# Patient Record
Sex: Male | Born: 1995 | Race: White | Hispanic: No | Marital: Single | Smoking: Current every day smoker
Health system: Southern US, Community
[De-identification: ages and names within clinical notes are randomized; demographics above are authoritative.]

---

## 2017-05-08 ENCOUNTER — Ambulatory Visit (INDEPENDENT_AMBULATORY_CARE_PROVIDER_SITE_OTHER): Payer: Self-pay

## 2017-05-08 ENCOUNTER — Other Ambulatory Visit: Payer: Self-pay

## 2017-05-08 ENCOUNTER — Ambulatory Visit (HOSPITAL_COMMUNITY)
Admission: EM | Admit: 2017-05-08 | Discharge: 2017-05-08 | Disposition: A | Discharge status: Home / Self Care | Payer: Self-pay | Attending: Urgent Care | Admitting: Urgent Care

## 2017-05-08 ENCOUNTER — Encounter (HOSPITAL_COMMUNITY): Payer: Self-pay | Admitting: Urgent Care

## 2017-05-08 DIAGNOSIS — Z720 Tobacco use: Secondary | ICD-10-CM

## 2017-05-08 DIAGNOSIS — R0981 Nasal congestion: Secondary | ICD-10-CM

## 2017-05-08 DIAGNOSIS — R0789 Other chest pain: Secondary | ICD-10-CM

## 2017-05-08 DIAGNOSIS — R059 Cough, unspecified: Secondary | ICD-10-CM

## 2017-05-08 DIAGNOSIS — R05 Cough: Secondary | ICD-10-CM

## 2017-05-08 DIAGNOSIS — R0602 Shortness of breath: Secondary | ICD-10-CM

## 2017-05-08 MED ORDER — PSEUDOEPHEDRINE HCL ER 120 MG PO TB12: 120.0000 mg | ORAL_TABLET | Freq: Two times a day (BID) | ORAL | 3 refills | Status: DC

## 2017-05-08 MED ORDER — BENZONATATE 100 MG PO CAPS: 100.0000 mg | ORAL_CAPSULE | Freq: Three times a day (TID) | ORAL | 0 refills | Status: DC | PRN

## 2017-05-08 MED ORDER — ACETAMINOPHEN 325 MG PO TABS
ORAL_TABLET | ORAL | Status: AC
Start: 2017-05-08 — End: 2017-05-08
  Filled 2017-05-08: qty 1

## 2017-05-08 MED ORDER — ACETAMINOPHEN 325 MG PO TABS
650.0000 mg | ORAL_TABLET | Freq: Once | ORAL | Status: AC
  Administered 2017-05-08: 650 mg via ORAL

## 2017-05-08 NOTE — ED Provider Notes (Signed)
  MRN: [id number] DOB: [birth year]  Subjective:   Joel Golden is a 22 y.o. male presenting for 2 week history of productive cough, body aches, nasal congestion. Cough elicits throat pain and mid-low sternal chest pain, has also had shob, started having vomiting (3 episodes) yesterday from his cough. Reports subjective fever today, some dizziness. Has tried ibuprofen, alka-seltzer, DayQuil, NyQuil with some relief. Smokes 1/2ppd. Does not hydrate well.   Review of Systems  HENT: Negative for ear discharge, ear pain and sinus pain.   Respiratory: Negative for hemoptysis.   Cardiovascular: Negative for palpitations.  Gastrointestinal: Negative for abdominal pain and nausea.  Skin: Negative for rash.    Joel Golden is not currently taking any medications and has No Known Allergies.  Joel Golden denies past medical and surgical history. Denies family history of lung disease.   Objective:   Vitals: BP (!) 112/56   Pulse (!) 120   Temp 100.1 F (37.8 C) (Oral)   Resp 18   SpO2 97%   Physical Exam  Constitutional: He is oriented to person, place, and time. He appears well-developed and well-nourished.  HENT:  Mouth/Throat: Oropharynx is clear and moist.  Eyes: No scleral icterus.  Neck: Normal range of motion. Neck supple.  Cardiovascular: Normal rate, regular rhythm and intact distal pulses. Exam reveals no gallop and no friction rub.  No murmur heard. Pulmonary/Chest: No respiratory distress. He has no wheezes. He has no rales.  Diminished lung sounds.  Lymphadenopathy:    He has no cervical adenopathy.  Neurological: He is alert and oriented to person, place, and time.  Skin: Skin is warm and dry.   Dg Chest 2 View  Result Date: 05/08/2017 CLINICAL DATA:  Cough and congestion. EXAM: CHEST  2 VIEW COMPARISON:  None. FINDINGS: The heart size and mediastinal contours are within normal limits. Both lungs are clear. The visualized skeletal structures are unremarkable. IMPRESSION: No active  cardiopulmonary disease. Electronically Signed   By: Gerome Samavid  Williams III M.D   On: 05/08/2017 19:29   Assessment and Plan :   Cough  Atypical chest pain  Nasal congestion  Shortness of breath  Tobacco use  Stop smoking, hydrate well. Use Tessalon, Sudafed. Return-to-clinic precautions discussed, patient verbalized understanding. Consider antibiotic course for sinusitis if symptoms persist. Patient did not have sinus pain and is actively smoking, not hydrating well so supportive measures appropriate to start with.   Wallis BambergMani, Brandilynn Taormina, PA-C 05/08/17 2002

## 2017-05-08 NOTE — ED Triage Notes (Signed)
Reports starting with cough, congestion, body aches approx 2 wks ago; states thought he was improving, but sxs worse again, and now started with vomiting yesterday.  States able to keep down some PO fluids inconsistently today.  Denies any known fevers.

## 2017-05-08 NOTE — Discharge Instructions (Signed)
For sore throat try using a honey-based tea. Use 3 teaspoons of honey with juice squeezed from half lemon. Place shaved pieces of ginger into 1/2-1 cup of water and warm over stove top. Then mix the ingredients and repeat every 4 hours as needed. Hydrate well with at least 2 liters of water.

## 2017-05-11 ENCOUNTER — Other Ambulatory Visit: Payer: Self-pay

## 2017-05-11 ENCOUNTER — Ambulatory Visit (HOSPITAL_COMMUNITY)
Admission: EM | Admit: 2017-05-11 | Discharge: 2017-05-11 | Disposition: A | Discharge status: Home / Self Care | Payer: Self-pay | Attending: Family Medicine | Admitting: Family Medicine

## 2017-05-11 ENCOUNTER — Encounter (HOSPITAL_COMMUNITY): Payer: Self-pay | Admitting: Emergency Medicine

## 2017-05-11 DIAGNOSIS — R6889 Other general symptoms and signs: Secondary | ICD-10-CM

## 2017-05-11 DIAGNOSIS — R05 Cough: Secondary | ICD-10-CM

## 2017-05-11 DIAGNOSIS — R059 Cough, unspecified: Secondary | ICD-10-CM

## 2017-05-11 MED ORDER — AZITHROMYCIN 250 MG PO TABS: 250.0000 mg | ORAL_TABLET | Freq: Every day | ORAL | 0 refills | Status: DC

## 2017-05-11 NOTE — ED Triage Notes (Signed)
Pt. Stated, I was here on Sunday and nobody gave me a diagnosis, the medicine I was given doesn't help

## 2017-05-12 NOTE — ED Provider Notes (Signed)
  [facility]   [id number] 05/11/17 Arrival Time: 1813  ASSESSMENT & PLAN:  1. Flu-like symptoms   2. Cough     Meds ordered this encounter  Medications  . azithromycin (ZITHROMAX) 250 MG tablet    Sig: Take 1 tablet (250 mg total) by mouth daily. Take first 2 tablets together, then 1 every day until finished.    Dispense:  6 tablet    Refill:  0   Given duration of symptoms will treat. OTC symptom care as needed. Ensure adequate fluid intake and rest. May f/u with PCP or here as needed.  Reviewed expectations re: course of current medical issues. Questions answered. Outlined signs and symptoms indicating need for more acute intervention. Patient verbalized understanding. After Visit Summary given.   SUBJECTIVE: History from: patient.  Joel Golden is a 22 y.o. male who presents with complaint of nasal congestion, post-nasal drainage, and a persistent dry cough. Onset abrupt, approximately 2 weeks ago. Some fatigue. SOB: none. Wheezing: none. Fever: no. Overall normal PO intake without n/v. Sick contacts: no. OTC treatment: decongestant without much relief.  Social History   Tobacco Use  Smoking Status Current Every Day Smoker  Smokeless Tobacco Never Used    ROS: As per HPI.   OBJECTIVE:  Vitals:   05/11/17 1924 05/11/17 1926  BP: 113/78   Pulse: 72   Resp: 17   Temp: 97.6 F (36.4 C)   TempSrc: Oral   SpO2: 100%   Weight:  115 lb (52.2 kg)  Height:  5\' 8"  (1.727 m)    General appearance: alert; appears fatigued HEENT: nasal congestion; clear runny nose; throat irritation secondary to post-nasal drainage Neck: supple without LAD Lungs: unlabored respirations, symmetrical air entry; cough: mild; no respiratory distress Skin: warm and dry Psychological: alert and cooperative; normal mood and affect   No Known Allergies  Social History   Socioeconomic History  . Marital status: Single    Spouse name: Not on file  . Number of children:  Not on file  . Years of education: Not on file  . Highest education level: Not on file  Social Needs  . Financial resource strain: Not on file  . Food insecurity - worry: Not on file  . Food insecurity - inability: Not on file  . Transportation needs - medical: Not on file  . Transportation needs - non-medical: Not on file  Occupational History  . Not on file  Tobacco Use  . Smoking status: Current Every Day Smoker  . Smokeless tobacco: Never Used  Substance and Sexual Activity  . Alcohol use: Yes    Comment: rare  . Drug use: Yes    Types: Marijuana  . Sexual activity: Not on file  Other Topics Concern  . Not on file  Social History Narrative  . Not on file           Mardella LaymanHagler, Leea Rambeau, MD 05/12/17 [phone]

## 2017-08-20 ENCOUNTER — Encounter (HOSPITAL_COMMUNITY): Payer: Self-pay | Admitting: Nurse Practitioner

## 2017-08-20 DIAGNOSIS — Y93G1 Activity, food preparation and clean up: Secondary | ICD-10-CM | POA: Insufficient documentation

## 2017-08-20 DIAGNOSIS — W260XXA Contact with knife, initial encounter: Secondary | ICD-10-CM | POA: Insufficient documentation

## 2017-08-20 DIAGNOSIS — Y929 Unspecified place or not applicable: Secondary | ICD-10-CM | POA: Insufficient documentation

## 2017-08-20 DIAGNOSIS — Y999 Unspecified external cause status: Secondary | ICD-10-CM | POA: Insufficient documentation

## 2017-08-20 DIAGNOSIS — S61215A Laceration without foreign body of left ring finger without damage to nail, initial encounter: Secondary | ICD-10-CM | POA: Insufficient documentation

## 2017-08-20 DIAGNOSIS — F17210 Nicotine dependence, cigarettes, uncomplicated: Secondary | ICD-10-CM | POA: Insufficient documentation

## 2017-08-20 DIAGNOSIS — Z23 Encounter for immunization: Secondary | ICD-10-CM | POA: Insufficient documentation

## 2017-08-20 NOTE — ED Triage Notes (Signed)
Pt presents with 1 cm long tip of the left ring finger that he reports he sustained from a kitchen knife. Unsure of tetanus status.

## 2017-08-21 ENCOUNTER — Emergency Department (HOSPITAL_COMMUNITY)
Admission: EM | Admit: 2017-08-21 | Discharge: 2017-08-21 | Disposition: A | Discharge status: Home / Self Care | Payer: Self-pay | Attending: Emergency Medicine | Admitting: Emergency Medicine

## 2017-08-21 DIAGNOSIS — S61215A Laceration without foreign body of left ring finger without damage to nail, initial encounter: Secondary | ICD-10-CM

## 2017-08-21 MED ORDER — BACITRACIN ZINC 500 UNIT/GM EX OINT
TOPICAL_OINTMENT | Freq: Two times a day (BID) | CUTANEOUS | Status: DC
  Administered 2017-08-21: 1 via TOPICAL
  Filled 2017-08-21: qty 0.9

## 2017-08-21 MED ORDER — TETANUS-DIPHTH-ACELL PERTUSSIS 5-2.5-18.5 LF-MCG/0.5 IM SUSP
0.5000 mL | Freq: Once | INTRAMUSCULAR | Status: AC
  Administered 2017-08-21: 0.5 mL via INTRAMUSCULAR
  Filled 2017-08-21: qty 0.5

## 2017-08-21 MED ORDER — OXYCODONE-ACETAMINOPHEN 5-325 MG PO TABS
1.0000 | ORAL_TABLET | Freq: Once | ORAL | Status: AC
  Administered 2017-08-21: 1 via ORAL
  Filled 2017-08-21: qty 1

## 2017-08-21 MED ORDER — LIDOCAINE HCL (PF) 1 % IJ SOLN
5.0000 mL | Freq: Once | INTRAMUSCULAR | Status: AC
  Administered 2017-08-21: 5 mL
  Filled 2017-08-21: qty 30

## 2017-08-21 NOTE — ED Provider Notes (Signed)
[facility]-EMERGENCY DEPT Provider Note   CSN: [id number] Arrival date & time: 08/20/17  2257     History   Chief Complaint Chief Complaint  Patient presents with  . Extremity Laceration    Left Ring Finger    HPI Joel Golden is a 22 y.o. male who presents to ED for evaluation of nondominant left fourth digit fingertip laceration that occurred prior to arrival.  He was cutting a lemon when he excellently cut the tip of his finger.  Patient states that bleeding has been controlled with pressure.  Denies any blood thinner use.  Denies any other injuries.  He is unsure of last tetanus.  HPI  History reviewed. No pertinent past medical history.  There are no active problems to display for this patient.   History reviewed. No pertinent surgical history.      Home Medications    Prior to Admission medications   Medication Sig Start Date End Date Taking? Authorizing Provider  azithromycin (ZITHROMAX) 250 MG tablet Take 1 tablet (250 mg total) by mouth daily. Take first 2 tablets together, then 1 every day until finished. Patient not taking: Reported on 08/21/2017 05/11/17   Mardella Layman, MD  benzonatate (TESSALON) 100 MG capsule Take 1-2 capsules (100-200 mg total) by mouth 3 (three) times daily as needed for cough. Patient not taking: Reported on 08/21/2017 05/08/17   Wallis Bamberg, PA-C  pseudoephedrine (SUDAFED 12 HOUR) 120 MG 12 hr tablet Take 1 tablet (120 mg total) by mouth 2 (two) times daily. Patient not taking: Reported on 08/21/2017 05/08/17   Wallis Bamberg, PA-C    Family History History reviewed. No pertinent family history.  Social History Social History   Tobacco Use  . Smoking status: Current Every Day Smoker  . Smokeless tobacco: Never Used  Substance Use Topics  . Alcohol use: Yes    Comment: rare  . Drug use: Yes    Types: Marijuana     Allergies   Patient has no known allergies.   Review of Systems Review of Systems    Constitutional: Negative for chills and fever.  Skin: Positive for wound.  Neurological: Negative for weakness and numbness.     Physical Exam Updated Vital Signs BP 125/73 (BP Location: Right Arm)   Pulse 70   Temp 97.9 F (36.6 C) (Oral)   Resp 17   Ht  (1.778 m)   Wt 50.6 kg (111 lb 9.6 oz)   SpO2 100%   BMI 16.01 kg/m   Physical Exam  Constitutional: He appears well-developed and well-nourished. No distress.  HENT:  Head: Normocephalic and atraumatic.  Eyes: Conjunctivae and EOM are normal. No scleral icterus.  Neck: Normal range of motion.  Pulmonary/Chest: Effort normal. No respiratory distress.  Musculoskeletal:  Full active and passive range of motion of the left hand digits without difficulty.  Neurological: He is alert.  Skin: No rash noted. He is not diaphoretic.  Laceration to left fourth digit.  Psychiatric: He has a normal mood and affect.  Nursing note and vitals reviewed.      ED Treatments / Results  Labs (all labs ordered are listed, but only abnormal results are displayed) Labs Reviewed - No data to display  EKG None  Radiology No results found.  Procedures .Marland KitchenLaceration Repair Date/Time: 08/21/2017 3:21 AM Performed by: Dietrich Pates, PA-C Authorized by: Dietrich Pates, PA-C   Consent:    Consent obtained:  Verbal   Consent given by:  Patient   Risks  discussed:  Infection, need for additional repair, nerve damage, pain, poor cosmetic result, poor wound healing, retained foreign body, tendon damage and vascular damage Anesthesia (see MAR for exact dosages):    Anesthesia method:  Local infiltration   Local anesthetic:  Lidocaine 2% WITH epi and lidocaine 1% w/o epi Laceration details:    Location:  Finger   Finger location:  L ring finger   Length (cm):  2 Repair type:    Repair type:  Simple Exploration:    Hemostasis achieved with:  Direct pressure Treatment:    Area cleansed with:  Saline   Amount of cleaning:   Standard   Irrigation solution:  Sterile saline   Irrigation method:  Pressure wash Skin repair:    Repair method:  Sutures   Suture size:  4-0   Wound skin closure material used: ethilon.   Suture technique:  Simple interrupted   Number of sutures:  5 Approximation:    Approximation:  Close Post-procedure details:    Dressing:  Antibiotic ointment   Patient tolerance of procedure:  Tolerated well, no immediate complications   (including critical care time)  Medications Ordered in ED Medications  bacitracin ointment (has no administration in time range)  lidocaine (PF) (XYLOCAINE) 1 % injection 5 mL (5 mLs Infiltration Given 08/21/17 0127)  Tdap (BOOSTRIX) injection 0.5 mL (0.5 mLs Intramuscular Given 08/21/17 0127)  oxyCODONE-acetaminophen (PERCOCET/ROXICET) 5-325 MG per tablet 1 tablet (1 tablet Oral Given 08/21/17 0302)     Initial Impression / Assessment and Plan / ED Course  I have reviewed the triage vital signs and the nursing notes.  Pertinent labs & imaging results that were available during my care of the patient were reviewed by me and considered in my medical decision making (see chart for details).     Patient counseled on wound care. Patient counseled on need to return or see PCP/urgent care for suture removal in 10-12 days. Patient was urged to return to the Emergency Department urgently with worsening pain, swelling, expanding erythema especially if it streaks away from the affected area, fever, or if they have any other concerns. Patient verbalized understanding.   Portions of this note were generated with Scientist, clinical (histocompatibility and immunogenetics). Dictation errors may occur despite best attempts at proofreading.   Final Clinical Impressions(s) / ED Diagnoses   Final diagnoses:  Laceration of left ring finger without foreign body without damage to nail, initial encounter    ED Discharge Orders    None       Dietrich Pates, PA-C 08/21/17 0322    Paula Libra,  MD 08/21/17 [phone]

## 2017-08-21 NOTE — ED Notes (Signed)
No respiratory or acute distress noted alert and oriented x 3 visitor at bedside call light in reach. 

## 2017-08-21 NOTE — Discharge Instructions (Addendum)
Return in 10 to 12 days for suture removal. °

## 2019-03-08 IMAGING — DX DG CHEST 2V
2 series · 2 of 2 positions shown · non-contrast
Comparison: None.

CLINICAL DATA: Cough and congestion.

EXAM:
CHEST  2 VIEW

[chest pa]
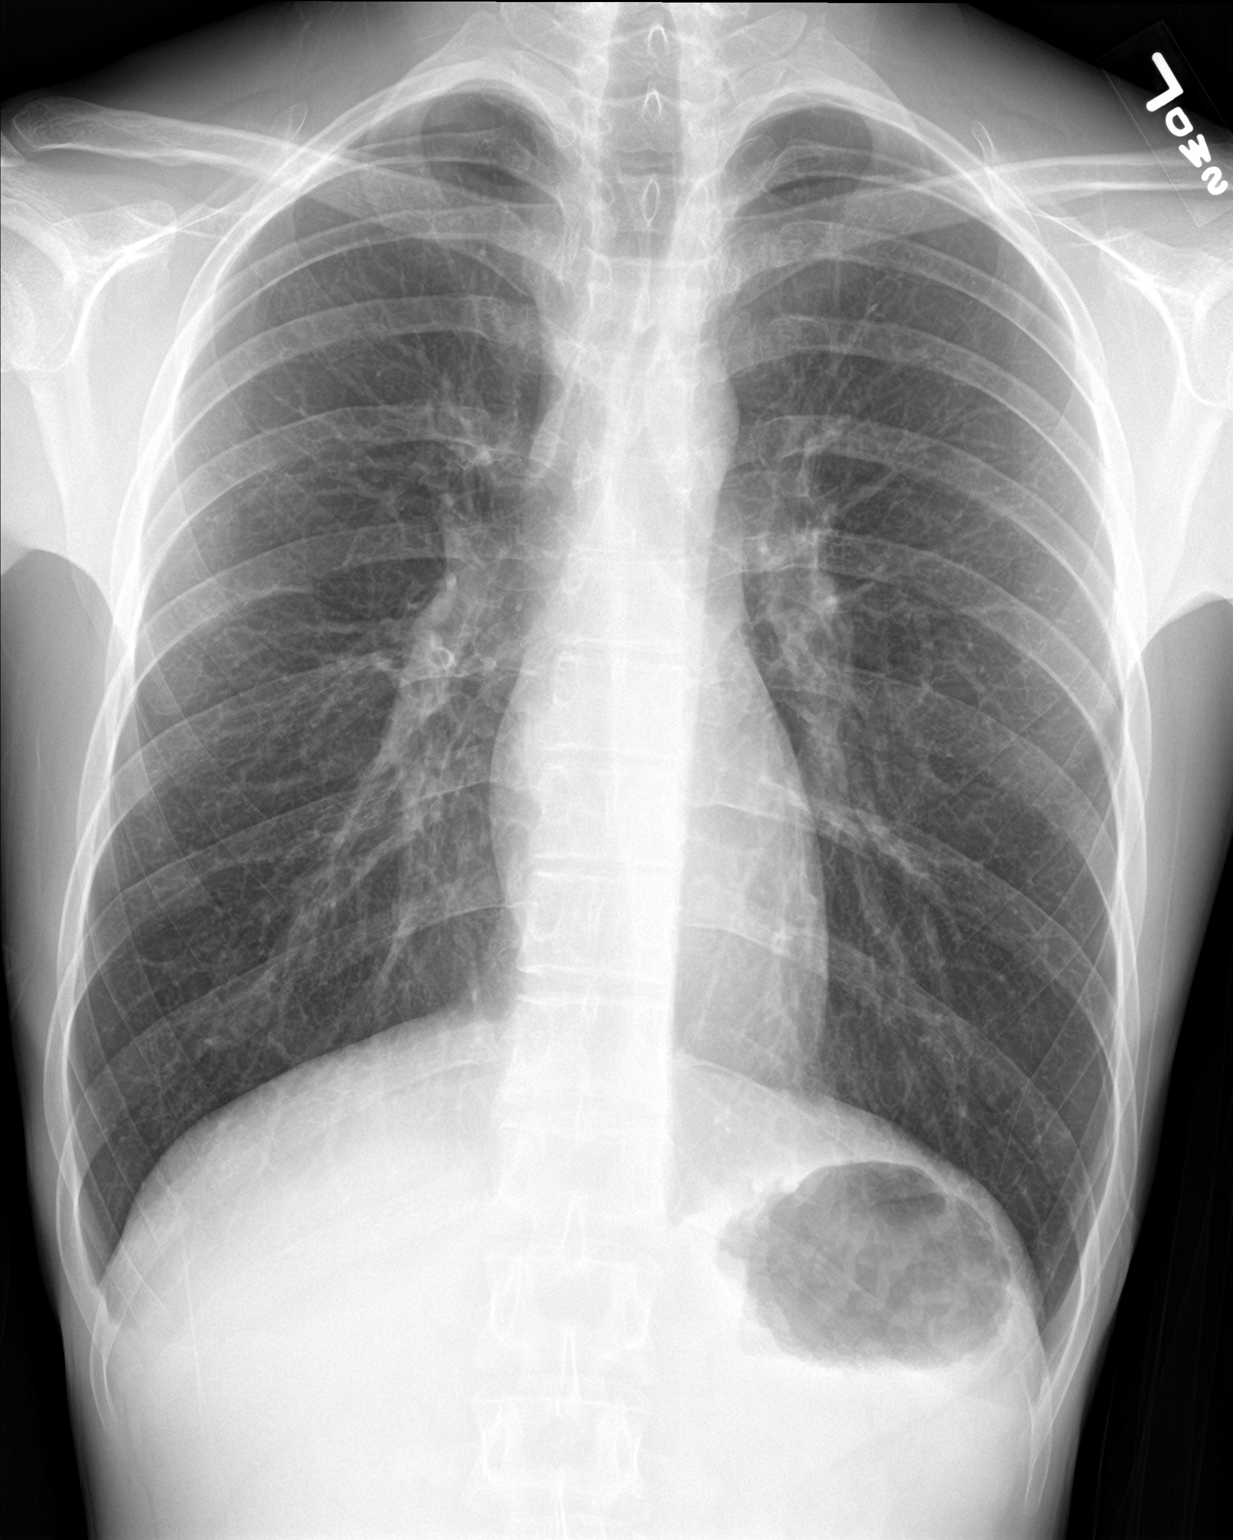

[chest lat]
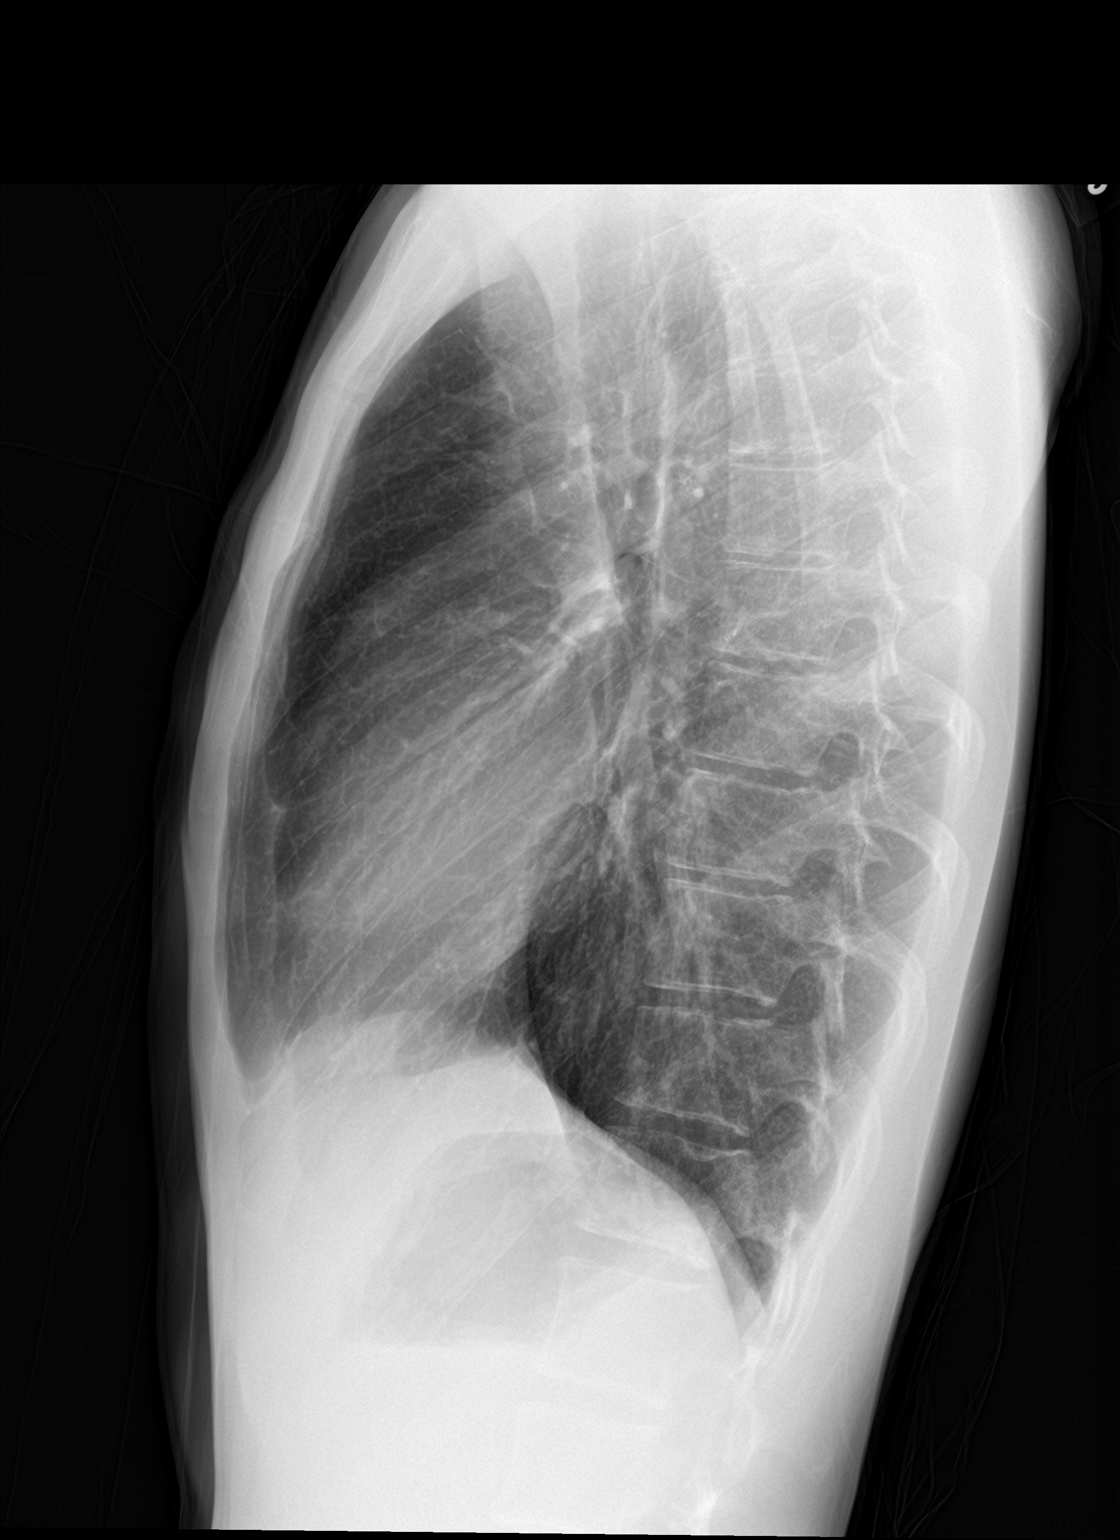

[2 of 2 positions shown; findings below may reference images not displayed]

FINDINGS: The heart size and mediastinal contours are within normal limits.
Both lungs are clear. The visualized skeletal structures are
unremarkable.
IMPRESSION: No active cardiopulmonary disease.

## 2022-04-25 ENCOUNTER — Encounter (HOSPITAL_COMMUNITY): Payer: Self-pay

## 2022-04-25 ENCOUNTER — Telehealth (HOSPITAL_COMMUNITY): Payer: Self-pay

## 2022-04-25 ENCOUNTER — Ambulatory Visit (HOSPITAL_COMMUNITY)
Admission: RE | Admit: 2022-04-25 | Discharge: 2022-04-25 | Disposition: A | Discharge status: Home / Self Care | Payer: Self-pay | Source: Ambulatory Visit | Attending: Emergency Medicine | Admitting: Emergency Medicine

## 2022-04-25 VITALS — BP 125/80 | HR 101 | Temp 98.1°F | Resp 18 | Wt 110.0 lb

## 2022-04-25 DIAGNOSIS — J069 Acute upper respiratory infection, unspecified: Secondary | ICD-10-CM

## 2022-04-25 DIAGNOSIS — J302 Other seasonal allergic rhinitis: Secondary | ICD-10-CM

## 2022-04-25 DIAGNOSIS — J4520 Mild intermittent asthma, uncomplicated: Secondary | ICD-10-CM

## 2022-04-25 MED ORDER — FLUTICASONE PROPIONATE 50 MCG/ACT NA SUSP: 1.0000 | Freq: Every day | NASAL | 2 refills | Status: AC

## 2022-04-25 MED ORDER — ALBUTEROL SULFATE HFA 108 (90 BASE) MCG/ACT IN AERS: 2.0000 | INHALATION_SPRAY | Freq: Four times a day (QID) | RESPIRATORY_TRACT | 0 refills | Status: AC | PRN

## 2022-04-25 MED ORDER — CETIRIZINE HCL 10 MG PO TABS: 10.0000 mg | ORAL_TABLET | Freq: Every day | ORAL | 1 refills | Status: AC

## 2022-04-25 NOTE — Discharge Instructions (Signed)
Your symptoms and physical exam findings are concerning for a viral respiratory infection.  Because respiratory allergies are not well controlled at this time, this makes you more susceptible to catching respiratory infections.  To avoid catching frequent respiratory infections, having skin reactions, dealing with eye irritation, losing sleep, missing work, etc., due to uncontrolled allergies, it is important that you begin allergy medications and are consistent with taking your meds exactly as prescribed.   Please see the list below for recommended medications, dosages and frequencies to provide relief of current symptoms:     Zyrtec (cetirizine): This is an excellent second-generation antihistamine that helps to reduce respiratory inflammatory response to environmental allergens.  In some patients, this medication can cause daytime sleepiness so I recommend that you give your child this medication at bedtime every day.     Flonase (fluticasone): This is a steroid nasal spray that you use once daily, 1 spray in each nare.  This medication does not work well if you decide to use it only used as you feel you need to, it works best used on a daily basis.  After 3 to 5 days of use, you will notice significant reduction of the inflammation and mucus production that is currently being caused by exposure to allergens, whether seasonal or environmental.  The most common side effect of this medication is nosebleeds.  If you experience a nosebleed, please discontinue use for 1 week, then feel free to resume.  I have provided you with a prescription.     ProAir, Ventolin, Proventil (albuterol): This inhaled medication contains a short acting beta agonist bronchodilator.  This medication works on the smooth muscle that opens and constricts of your airways by relaxing the muscle.  The result of relaxation of the smooth muscle is increased air movement and improved work of breathing.  This is a short acting medication  that can be used every 4-6 hours as needed for increased work of breathing, shortness of breath, wheezing and excessive coughing.  I have provided you with a prescription.    If you find that you have not had improvement of your symptoms in the next 5 to 7 days, please follow-up with your primary care provider or return here to urgent care for repeat evaluation and further recommendations.   Thank you for visiting urgent care today.  We appreciate the opportunity to participate in your care.

## 2022-04-25 NOTE — ED Triage Notes (Signed)
Pt reports sx started a month ago. Body aches, congestion, sore throat. Coughing fleam up, nausea and diarrhea. Waking up in sweat. Took advil and robitussin. Hasn't taken anything today.

## 2022-04-25 NOTE — Telephone Encounter (Signed)
Patient calling about medications sent in today. Medications sent to [facility] on [address] which is closed.  Patient informed that he may ask the pharmacy of [facility] at [facility] to pull the medications over for him. Patient verbalized understanding

## 2022-04-25 NOTE — ED Provider Notes (Signed)
[facility]    CSN: [id number] Arrival date & time: 04/25/22  1439    HISTORY   Chief Complaint  Patient presents with   Sore Throat   Nausea   Cough   Diarrhea   Generalized Body Aches   HPI Joel Golden is a pleasant, 27 y.o. male who presents to urgent care today. Patient complains of a 1 month history of intermittent episodes of sore throat, body aches, nasal congestion, cough productive of phlegm, shortness of breath, nausea, diarrhea and night sweats.  Patient reports a history of allergies and asthma when he was younger, not currently taking medications for either.  Patient states he has been taking Advil and Robitussin but has not taken anything today.  The history is provided by the patient.   History reviewed. No pertinent past medical history. There are no problems to display for this patient.  History reviewed. No pertinent surgical history.  Home Medications    Prior to Admission medications   Not on File    Family History History reviewed. No pertinent family history. Social History Social History   Tobacco Use   Smoking status: Every Day   Smokeless tobacco: Never  Vaping Use   Vaping Use: Never used  Substance Use Topics   Alcohol use: Yes    Comment: rare   Drug use: Yes    Types: Marijuana   Allergies   Patient has no known allergies.  Review of Systems Review of Systems Pertinent findings revealed after performing a 14 point review of systems has been noted in the history of present illness.  Physical Exam Triage Vital Signs ED Triage Vitals  Enc Vitals Group     BP 01/30/21 0827 (!) 147/82     Pulse Rate 01/30/21 0827 72     Resp 01/30/21 0827 18     Temp 01/30/21 0827 98.3 F (36.8 C)     Temp Source 01/30/21 0827 Oral     SpO2 01/30/21 0827 98 %     Weight --      Height --      Head Circumference --      Peak Flow --      Pain Score 01/30/21 0826 5     Pain Loc --      Pain Edu? --      Excl. in [place]? --    No data found.  Updated Vital Signs BP 125/80 (BP Location: Right Arm)   Pulse (!) 101   Temp 98.1 F (36.7 C) (Oral)   Resp 18   Wt 110 lb (49.9 kg)   SpO2 98%   BMI 15.78 kg/m   Physical Exam Vitals and nursing note reviewed.  Constitutional:      General: He is not in acute distress.    Appearance: Normal appearance. He is not ill-appearing.  HENT:     Head: Normocephalic and atraumatic.     Salivary Glands: Right salivary gland is not diffusely enlarged or tender. Left salivary gland is not diffusely enlarged or tender.     Right Ear: Ear canal and external ear normal. No drainage. A middle ear effusion is present. There is no impacted cerumen. Tympanic membrane is bulging. Tympanic membrane is not injected or erythematous.     Left Ear: Ear canal and external ear normal. No drainage. A middle ear effusion is present. There is no impacted cerumen. Tympanic membrane is bulging. Tympanic membrane is not injected or erythematous.     Ears:  Comments: Bilateral EACs normal, both TMs bulging with clear fluid    Nose: Rhinorrhea present. No nasal deformity, septal deviation, signs of injury, nasal tenderness, mucosal edema or congestion. Rhinorrhea is clear.     Right Nostril: Occlusion present. No foreign body, epistaxis or septal hematoma.     Left Nostril: Occlusion present. No foreign body, epistaxis or septal hematoma.     Right Turbinates: Enlarged, swollen and pale.     Left Turbinates: Enlarged, swollen and pale.     Right Sinus: No maxillary sinus tenderness or frontal sinus tenderness.     Left Sinus: No maxillary sinus tenderness or frontal sinus tenderness.     Mouth/Throat:     Lips: Pink. No lesions.     Mouth: Mucous membranes are moist. No oral lesions.     Pharynx: Oropharynx is clear. Uvula midline. No posterior oropharyngeal erythema or uvula swelling.     Tonsils: No tonsillar exudate. 0 on the right. 0 on the left.     Comments: Postnasal drip Eyes:      General: Lids are normal.        Right eye: No discharge.        Left eye: No discharge.     Extraocular Movements: Extraocular movements intact.     Conjunctiva/sclera: Conjunctivae normal.     Right eye: Right conjunctiva is not injected.     Left eye: Left conjunctiva is not injected.  Neck:     Trachea: Trachea and phonation normal.  Cardiovascular:     Rate and Rhythm: Normal rate and regular rhythm.     Pulses: Normal pulses.     Heart sounds: Normal heart sounds. No murmur heard.    No friction rub. No gallop.  Pulmonary:     Effort: Pulmonary effort is normal. No accessory muscle usage, prolonged expiration or respiratory distress.     Breath sounds: No stridor, decreased air movement or transmitted upper airway sounds. Examination of the right-middle field reveals decreased breath sounds. Examination of the left-middle field reveals decreased breath sounds. Examination of the right-lower field reveals decreased breath sounds. Examination of the left-lower field reveals decreased breath sounds. Decreased breath sounds present. No wheezing, rhonchi or rales.  Chest:     Chest wall: No tenderness.  Musculoskeletal:        General: Normal range of motion.     Cervical back: Normal range of motion and neck supple. Normal range of motion.  Lymphadenopathy:     Cervical: No cervical adenopathy.  Skin:    General: Skin is warm and dry.     Findings: No erythema or rash.  Neurological:     General: No focal deficit present.     Mental Status: He is alert and oriented to person, place, and time.  Psychiatric:        Mood and Affect: Mood normal.        Behavior: Behavior normal.     Visual Acuity Right Eye Distance:   Left Eye Distance:   Bilateral Distance:    Right Eye Near:   Left Eye Near:    Bilateral Near:     UC Couse / Diagnostics / Procedures:     Radiology No results found.  Procedures Procedures (including critical care time) EKG  Pending results:   Labs Reviewed - No data to display  Medications Ordered in UC: Medications - No data to display  UC Diagnoses / Final Clinical Impressions(s)   I have reviewed the triage vital signs  and the nursing notes.  Pertinent labs & imaging results that were available during my care of the patient were reviewed by me and considered in my medical decision making (see chart for details).    Final diagnoses:  Viral upper respiratory tract infection with cough  Mild intermittent asthma without complication  Seasonal allergic rhinitis, unspecified trigger   Patient has normal oxygen saturation on arrival today.  His physical exam findings are concerning for uncontrolled allergies and asthma.  Patient provided with prescription for Zyrtec, Flonase and albuterol and advised to use for the next several weeks.  Patient advised to follow-up in 5 7 days if not feeling any better.  Please see discharge instructions below for further details of plan of care as provided to patient. ED Prescriptions     Medication Sig Dispense Auth. Provider   cetirizine (ZYRTEC ALLERGY) 10 MG tablet Take 1 tablet (10 mg total) by mouth at bedtime. 90 tablet Lynden Oxford Scales, PA-C   fluticasone (FLONASE) 50 MCG/ACT nasal spray Place 1 spray into both nostrils daily. Begin by using 2 sprays in each nare daily for 3 to 5 days, then decrease to 1 spray in each nare daily. 15.8 mL Lynden Oxford Scales, PA-C   albuterol (VENTOLIN HFA) 108 (90 Base) MCG/ACT inhaler Inhale 2 puffs into the lungs every 6 (six) hours as needed for wheezing or shortness of breath (Cough). 18 g Lynden Oxford Scales, PA-C      PDMP not reviewed this encounter.  Disposition Upon Discharge:  Condition: stable for discharge home Home: take medications as prescribed; routine discharge instructions as discussed; follow up as advised.  Patient presented with an acute illness with associated systemic symptoms and significant discomfort requiring  urgent management. In my opinion, this is a condition that a prudent lay person (someone who possesses an average knowledge of health and medicine) may potentially expect to result in complications if not addressed urgently such as respiratory distress, impairment of bodily function or dysfunction of bodily organs.   Routine symptom specific, illness specific and/or disease specific instructions were discussed with the patient and/or caregiver at length.   As such, the patient has been evaluated and assessed, work-up was performed and treatment was provided in alignment with urgent care protocols and evidence based medicine.  Patient/parent/caregiver has been advised that the patient may require follow up for further testing and treatment if the symptoms continue in spite of treatment, as clinically indicated and appropriate.  If the patient was tested for COVID-19, Influenza and/or RSV, then the patient/parent/guardian was advised to isolate at home pending the results of his/her diagnostic coronavirus test and potentially longer if they're positive. I have also advised pt that if his/her COVID-19 test returns positive, it's recommended to self-isolate for at least 10 days after symptoms first appeared AND until fever-free for 24 hours without fever reducer AND other symptoms have improved or resolved. Discussed self-isolation recommendations as well as instructions for household member/close contacts as per the [facility] and [place] DHHS, and also gave patient the [place] packet with this information.  Patient/parent/caregiver has been advised to return to the [facility] or PCP in 3-5 days if no better; to PCP or the Emergency Department if new signs and symptoms develop, or if the current signs or symptoms continue to change or worsen for further workup, evaluation and treatment as clinically indicated and appropriate  The patient will follow up with their current PCP if and as advised. If the patient does not currently  have a  PCP we will assist them in obtaining one.   The patient may need specialty follow up if the symptoms continue, in spite of conservative treatment and management, for further workup, evaluation, consultation and treatment as clinically indicated and appropriate.  Patient/parent/caregiver verbalized understanding and agreement of plan as discussed.  All questions were addressed during visit.  Please see discharge instructions below for further details of plan.  Discharge Instructions:   Discharge Instructions      Your symptoms and physical exam findings are concerning for a viral respiratory infection.  Because respiratory allergies are not well controlled at this time, this makes you more susceptible to catching respiratory infections.  To avoid catching frequent respiratory infections, having skin reactions, dealing with eye irritation, losing sleep, missing work, etc., due to uncontrolled allergies, it is important that you begin allergy medications and are consistent with taking your meds exactly as prescribed.   Please see the list below for recommended medications, dosages and frequencies to provide relief of current symptoms:     Zyrtec (cetirizine): This is an excellent second-generation antihistamine that helps to reduce respiratory inflammatory response to environmental allergens.  In some patients, this medication can cause daytime sleepiness so I recommend that you give your child this medication at bedtime every day.     Flonase (fluticasone): This is a steroid nasal spray that you use once daily, 1 spray in each nare.  This medication does not work well if you decide to use it only used as you feel you need to, it works best used on a daily basis.  After 3 to 5 days of use, you will notice significant reduction of the inflammation and mucus production that is currently being caused by exposure to allergens, whether seasonal or environmental.  The most common side effect of  this medication is nosebleeds.  If you experience a nosebleed, please discontinue use for 1 week, then feel free to resume.  I have provided you with a prescription.     ProAir, Ventolin, Proventil (albuterol): This inhaled medication contains a short acting beta agonist bronchodilator.  This medication works on the smooth muscle that opens and constricts of your airways by relaxing the muscle.  The result of relaxation of the smooth muscle is increased air movement and improved work of breathing.  This is a short acting medication that can be used every 4-6 hours as needed for increased work of breathing, shortness of breath, wheezing and excessive coughing.  I have provided you with a prescription.    If you find that you have not had improvement of your symptoms in the next 5 to 7 days, please follow-up with your primary care provider or return here to urgent care for repeat evaluation and further recommendations.   Thank you for visiting urgent care today.  We appreciate the opportunity to participate in your care.       This office note has been dictated using Teaching laboratory technician.  Unfortunately, this method of dictation can sometimes lead to typographical or grammatical errors.  I apologize for your inconvenience in advance if this occurs.  Please do not hesitate to reach out to me if clarification is needed.      Theadora Rama Scales, PA-C 04/25/22 1506
